# Patient Record
Sex: Male | Born: 1995 | Race: White | Hispanic: No | Smoking: Current every day smoker
Health system: Southern US, Community
[De-identification: ages and names within clinical notes are randomized; demographics above are authoritative.]

## PROBLEM LIST (undated history)

## (undated) HISTORY — PX: HERNIA REPAIR: SHX51

---
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
## 2016-12-02 DIAGNOSIS — S9031XA Contusion of right foot, initial encounter: Secondary | ICD-10-CM | POA: Diagnosis not present

## 2017-04-13 ENCOUNTER — Encounter: Payer: Self-pay | Admitting: Physician Assistant

## 2017-04-13 ENCOUNTER — Ambulatory Visit (INDEPENDENT_AMBULATORY_CARE_PROVIDER_SITE_OTHER): Payer: 59 | Admitting: Physician Assistant

## 2017-04-13 VITALS — BP 126/82 | HR 80 | Resp 16 | Ht 66.0 in | Wt 252.0 lb

## 2017-04-13 DIAGNOSIS — M7918 Myalgia, other site: Secondary | ICD-10-CM

## 2017-04-13 DIAGNOSIS — M791 Myalgia: Secondary | ICD-10-CM

## 2017-04-13 DIAGNOSIS — L0501 Pilonidal cyst with abscess: Secondary | ICD-10-CM | POA: Diagnosis not present

## 2017-04-13 MED ORDER — DOXYCYCLINE HYCLATE 100 MG PO TABS: 100.0000 mg | ORAL_TABLET | Freq: Two times a day (BID) | ORAL | 0 refills | Status: AC

## 2017-04-13 NOTE — Progress Notes (Signed)
   Paul Daniel  MRN: [id number] DOB: 06/25/1996  PCP: Patient, No Pcp Per  Subjective:  Pt is a 21 year old male who presents to clinic for worsening pain in tail bone area x 1 week. 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 Pain is worse when he leans back in a seated position. No MOI. He has been taking Naproxen for pain.  Denies drainage, fever, chills, decreased ROM.   Review of Systems  Constitutional: Negative for chills, diaphoresis and fever.  Musculoskeletal: Positive for back pain (around his gluteus ). Negative for gait problem.    There are no active problems to display for this patient.   No current outpatient prescriptions on file prior to visit.   No current facility-administered medications on file prior to visit.     No Known Allergies   Objective:  BP 126/82 (BP Location: Left Arm, Patient Position: Sitting, Cuff Size: Large)   Pulse 80   Resp 16   Ht 5\' 6"  (1.676 m)   Wt 252 lb (114.3 kg)   BMI 40.67 kg/m   Physical Exam  Constitutional: He is oriented to person, place, and time and well-developed, well-nourished, and in no distress. No distress.  Cardiovascular: Normal rate, regular rhythm and normal heart sounds.   Neurological: He is alert and oriented to person, place, and time. GCS score is 15.  Skin: Skin is warm and dry.     Psychiatric: Mood, memory, affect and judgment normal.  Vitals reviewed.   Assessment and Plan :  1. Pilonidal cyst with abscess 2. Gluteal pain - doxycycline (VIBRA-TABS) 100 MG tablet; Take 1 tablet (100 mg total) by mouth 2 (two) times daily.  Dispense: 20 tablet; Refill: 0 - Discussed with pt need for I&D. He declines the procedure as he is in town for work and cannot miss any days. He agrees to apply heat several times a day. RTC if symptoms worsen, or after his shifts in a few days for wound check. Will start antibiotic. He agrees with plan.   Marco CollieWhitney Maxson Oddo, PA-C  Primary Care at  [facility] 04/13/2017 8:28 AM

## 2017-04-13 NOTE — Patient Instructions (Addendum)
You can take 676m Ibuprofen and 500 mg Tylenol together at the same time for pain.  [facility].  Place heating pad to area 3-4 times a day.  Get a donut pillow to sit on.  Place a maxipad in your boxer shorts.  Come back Saturday if you're still not better.   Thank you for coming in today. I hope you feel we met your needs.  Feel free to call [facility] if you have any questions or further requests.  Please consider signing up for MyChart if you do not already have it, as this is a great way to communicate with me.  Best,  Whitney McVey, PA-C  Pilonidal Cyst A pilonidal cyst is a fluid-filled sac. It forms beneath the skin near your tailbone, at the top of the crease of your buttocks. A pilonidal cyst that is not large or infected may not cause symptoms or problems. If the cyst becomes irritated or infected, it may fill with pus. This causes pain and swelling (pilonidal abscess). An infected cyst may need to be treated with medicine, drained, or removed. What are the causes? The cause of a pilonidal cyst is not known. One cause may be a hair that grows into your skin (ingrown hair). What increases the risk? Pilonidal cysts are more common in boys and men. Risk factors include:  Having lots of hair near the crease of the buttocks.  Being overweight.  Having a pilonidal dimple.  Wearing tight clothing.  Not bathing or showering frequently.  Sitting for long periods of time.  What are the signs or symptoms? Signs and symptoms of a pilonidal cyst may include:  Redness.  Pain and tenderness.  Warmth.  Swelling.  Pus.  Fever.  How is this diagnosed? Your health care provider may diagnose a pilonidal cyst based on your symptoms and a physical exam. The health care provider may do a blood test to check for infection. If your cyst is draining pus, your health care provider may take a sample of the drainage to be tested at a laboratory. How is this treated? Surgery is  the usual treatment for an infected pilonidal cyst. You may also have to take medicines before surgery. The type of surgery you have depends on the size and severity of the infected cyst. The different kinds of surgery include:  Incision and drainage. This is a procedure to open and drain the cyst.  Marsupialization. In this procedure, a large cyst or abscess may be opened and kept open by stitching the edges of the skin to the cyst walls.  Cyst removal. This procedure involves opening the skin and removing all or part of the cyst.  Follow these instructions at home:  Follow all of your surgeon's instructions carefully if you had surgery.  Take medicines only as directed by your health care provider.  If you were prescribed an antibiotic medicine, finish it all even if you start to feel better.  Keep the area around your pilonidal cyst clean and dry.  Clean the area as directed by your health care provider. Pat the area dry with a clean towel. Do not rub it as this may cause bleeding.  Remove hair from the area around the cyst as directed by your health care provider.  Do not wear tight clothing or sit in one place for long periods of time.  There are many different ways to close and cover an incision, including stitches, skin glue, and adhesive strips. Follow your  health care provider's instructions on: ? Incision care. ? Bandage (dressing) changes and removal. ? Incision closure removal. Contact a health care provider if:  You have drainage, redness, swelling, or pain at the site of the cyst.  You have a fever. This information is not intended to replace advice given to you by your health care provider. Make sure you discuss any questions you have with your health care provider. Document Released: 10/15/2000 Document Revised: 03/25/2016 Document Reviewed: 03/07/2014 Elsevier Interactive Patient Education  2018 [employer].  IF you received an x-ray today, you will receive an  invoice from [facility]. Please contact [facility] at [phone] with questions or concerns regarding your invoice.   IF you received labwork today, you will receive an invoice from [facility]. Please contact [facility] at [phone] with questions or concerns regarding your invoice.   Our billing staff will not be able to assist you with questions regarding bills from these companies.  You will be contacted with the lab results as soon as they are available. The fastest way to get your results is to activate your My Chart account. Instructions are located on the last page of this paperwork. If you have not heard from [place] regarding the results in 2 weeks, please contact this office.

## 2017-04-14 ENCOUNTER — Encounter: Payer: Self-pay | Admitting: Family Medicine

## 2017-04-14 ENCOUNTER — Ambulatory Visit (INDEPENDENT_AMBULATORY_CARE_PROVIDER_SITE_OTHER): Payer: 59 | Admitting: Family Medicine

## 2017-04-14 VITALS — BP 118/64 | HR 85 | Temp 98.2°F | Resp 18 | Ht 66.0 in | Wt 248.8 lb

## 2017-04-14 DIAGNOSIS — L0231 Cutaneous abscess of buttock: Secondary | ICD-10-CM | POA: Diagnosis not present

## 2017-04-14 NOTE — Progress Notes (Signed)
   Paul Daniel is a 21 y.o. male who presents[facility]lin [facility]ia [facility] for FU for abscess:  1.  Pilonidal abscess:  Patient seen here yesterday for the same.  Discussion had at that time about I&D.  Patient very hesitant about this and declined.  Started on doxycycline. He continued to use heat pads throughout the day yesterday. Woke up this morning at 8:00 and felt drainage at the site. Made an appointment to come back and be seen. In the exam room has felt a "pop" and this felt more drainage.  No fevers or chills. Eating and drinking well. No nausea vomiting. The naproxen is been controlling his pain.  ROS as above.    PMH reviewed. Patient is a nonsmoker.   No past medical history on file. Past Surgical History:  Procedure Laterality Date  . HERNIA REPAIR     as an infant  . TYMPANOSTOMY TUBE PLACEMENT Bilateral 12/1998    Medications reviewed. Current Outpatient Prescriptions  Medication Sig Dispense Refill  . doxycycline (VIBRA-TABS) 100 MG tablet Take 1 tablet (100 mg total) by mouth 2 (two) times daily. 20 tablet 0   No current facility-administered medications for this visit.      Physical Exam:  BP 118/64 (BP Location: Right Arm, Patient Position: Sitting, Cuff Size: Large)   Pulse 85   Temp 98.2 F (36.8 C) (Oral)   Resp 18   Ht 5\' 6"  (1.676 m)   Wt 248 lb 12.8 oz (112.9 kg)   SpO2 99%   BMI 40.16 kg/m  Gen:  Alert, cooperative patient who appears stated age in no acute distress.  Vital signs reviewed. HEENT: EOMI,  MMM Skin:  Self draining pilonidal abscess. No surrounding area of cellulitis. No other skin lesions noted.  TTP here.   Assessment and Plan:  1.  Pilonidal abscess: - easily expressed fluid here in clinic.  Drained entirety of abscess.  Patient tolerated procedure well - Bandaged here.  Return precautions provided. - continue abx for full course - return if symptoms return.

## 2017-04-14 NOTE — Patient Instructions (Addendum)
It was good to see you today.  Everything looks good as we were able to drain the entire cyst.  Keep it covered, mostly to help with the drainage.   You can change this when it gets soaked.  Wash it with soap and water as usual.  If it starts to become swollen and painful again, come back and see us.  Take the rest of your doxycycline.     IF you received an x-ray today, you will receive an invoice from [facility]. Please contact [facility] at [phone] with questions or concerns regarding your invoice.   IF you received labwork today, you will receive an invoice from [facility]. Please contact [facility] at [phone] with questions or concerns regarding your invoice.   Our billing staff will not be able to assist you with questions regarding bills from these companies.  You will be contacted with the lab results as soon as they are available. The fastest way to get your results is to activate your My Chart account. Instructions are located on the last page of this paperwork. If you have not heard from us regarding the results in 2 weeks, please contact this office.

## 2023-08-29 ENCOUNTER — Encounter: Payer: Self-pay | Admitting: Family Medicine
